# Patient Record
Sex: Female | Born: 2005 | Race: Black or African American | Hispanic: No | Marital: Single | Smoking: Never smoker
Health system: Southern US, Community
[De-identification: ages and names within clinical notes are randomized; demographics above are authoritative.]

## PROBLEM LIST (undated history)

## (undated) HISTORY — PX: TONSILLECTOMY: SUR1361

---
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
## 2024-04-26 ENCOUNTER — Emergency Department (HOSPITAL_COMMUNITY)
Admission: EM | Admit: 2024-04-26 | Discharge: 2024-04-26 | Disposition: A | Discharge status: Home / Self Care | Attending: Emergency Medicine | Admitting: Emergency Medicine

## 2024-04-26 ENCOUNTER — Encounter (HOSPITAL_COMMUNITY): Payer: Self-pay | Admitting: *Deleted

## 2024-04-26 ENCOUNTER — Other Ambulatory Visit: Payer: Self-pay

## 2024-04-26 DIAGNOSIS — R42 Dizziness and giddiness: Secondary | ICD-10-CM | POA: Diagnosis present

## 2024-04-26 LAB — CBC WITH DIFFERENTIAL/PLATELET
Abs Immature Granulocytes: 0.03 10*3/uL (ref 0.00–0.07)
Basophils Absolute: 0 10*3/uL (ref 0.0–0.1)
Basophils Relative: 0 %
Eosinophils Absolute: 0.2 10*3/uL (ref 0.0–0.5)
Eosinophils Relative: 2 %
HCT: 40.7 % (ref 36.0–46.0)
Hemoglobin: 13 g/dL (ref 12.0–15.0)
Immature Granulocytes: 0 %
Lymphocytes Relative: 16 %
Lymphs Abs: 1.5 10*3/uL (ref 0.7–4.0)
MCH: 27.8 pg (ref 26.0–34.0)
MCHC: 31.9 g/dL (ref 30.0–36.0)
MCV: 87 fL (ref 80.0–100.0)
Monocytes Absolute: 0.6 10*3/uL (ref 0.1–1.0)
Monocytes Relative: 7 %
Neutro Abs: 7.2 10*3/uL (ref 1.7–7.7)
Neutrophils Relative %: 75 %
Platelets: 262 10*3/uL (ref 150–400)
RBC: 4.68 MIL/uL (ref 3.87–5.11)
RDW: 11.9 % (ref 11.5–15.5)
WBC: 9.5 10*3/uL (ref 4.0–10.5)
nRBC: 0 % (ref 0.0–0.2)

## 2024-04-26 LAB — URINALYSIS, ROUTINE W REFLEX MICROSCOPIC
Bilirubin Urine: NEGATIVE
Glucose, UA: NEGATIVE mg/dL
Hgb urine dipstick: NEGATIVE
Ketones, ur: NEGATIVE mg/dL
Leukocytes,Ua: NEGATIVE
Nitrite: NEGATIVE
Protein, ur: 30 mg/dL — AB
Specific Gravity, Urine: 1.025 (ref 1.005–1.030)
pH: 7 (ref 5.0–8.0)

## 2024-04-26 LAB — BASIC METABOLIC PANEL WITH GFR
Anion gap: 10 (ref 5–15)
BUN: 12 mg/dL (ref 6–20)
CO2: 24 mmol/L (ref 22–32)
Calcium: 9.3 mg/dL (ref 8.9–10.3)
Chloride: 108 mmol/L (ref 98–111)
Creatinine, Ser: 0.85 mg/dL (ref 0.44–1.00)
GFR, Estimated: >60 mL/min (ref >60)
Glucose, Bld: 89 mg/dL (ref 70–99)
Potassium: 3.8 mmol/L (ref 3.5–5.1)
Sodium: 142 mmol/L (ref 135–145)

## 2024-04-26 LAB — PREGNANCY, URINE: Preg Test, Ur: NEGATIVE

## 2024-04-26 NOTE — ED Provider Notes (Signed)
 [facility] Provider Note   CSN: [id number] Arrival date & time: 04/26/24  1427    History  Chief Complaint  Patient presents with   Dizziness    Cathy Ward is a 18 y.o. female here for evaluation of lightheadedness.  States woke up this morning while she was at work she felt lightheaded for a few minutes.  Symptoms resolved prior to arrival to the emergency department.  No headache, vision changes, numbness, weakness, chest pain, shortness of breath, abdominal pain. Due for cycle.  No dysuria or hematuria.  No history of aneurysms, connective tissue disorders. no recent head trauma.  HPI     Home Medications Prior to Admission medications   Not on File      Allergies    Patient has no known allergies.    Review of Systems   Review of Systems  Constitutional: Negative.   HENT: Negative.    Respiratory: Negative.    Cardiovascular: Negative.   Gastrointestinal: Negative.   Genitourinary: Negative.   Musculoskeletal: Negative.   Skin: Negative.   Neurological:  Positive for light-headedness.  All other systems reviewed and are negative.   Physical Exam Updated Vital Signs BP 108/81 (BP Location: Left Arm)   Pulse (!) 59   Temp 98.3 F (36.8 C)   Resp 18   Ht 5\' 7"  (1.702 m)   Wt 61.7 kg   LMP 03/27/2024   SpO2 100%   BMI 21.30 kg/m  Physical Exam Vitals and nursing note reviewed.  Constitutional:      General: She is not in acute distress.    Appearance: She is well-developed. She is not ill-appearing, toxic-appearing or diaphoretic.  HENT:     Head: Normocephalic and atraumatic.     Nose: Nose normal.     Mouth/Throat:     Mouth: Mucous membranes are moist.  Eyes:     Pupils: Pupils are equal, round, and reactive to light.  Cardiovascular:     Rate and Rhythm: Normal rate.     Pulses: Normal pulses.          Radial pulses are 2+ on the right side and 2+ on the left side.     Heart sounds: Normal  heart sounds.  Pulmonary:     Effort: Pulmonary effort is normal. No respiratory distress.     Breath sounds: Normal breath sounds.  Abdominal:     General: Bowel sounds are normal. There is no distension.     Palpations: Abdomen is soft.  Musculoskeletal:        General: No tenderness or signs of injury. Normal range of motion.     Cervical back: Normal range of motion.     Right lower leg: No edema.     Left lower leg: No edema.  Skin:    General: Skin is warm and dry.     Capillary Refill: Capillary refill takes less than 2 seconds.  Neurological:     General: No focal deficit present.     Mental Status: She is alert and oriented to person, place, and time.     Cranial Nerves: No cranial nerve deficit.     Motor: No weakness.     Gait: Gait normal.  Psychiatric:        Mood and Affect: Mood normal.     ED Results / Procedures / Treatments   Labs (all labs ordered are listed, but only abnormal results are displayed) Labs Reviewed  URINALYSIS,  ROUTINE W REFLEX MICROSCOPIC - Abnormal; Notable for the following components:      Result Value   APPearance HAZY (*)    Protein, ur 30 (*)    Bacteria, UA RARE (*)    All other components within normal limits  BASIC METABOLIC PANEL WITH GFR  CBC WITH DIFFERENTIAL/PLATELET  PREGNANCY, URINE    EKG None  Radiology No results found.  Procedures Procedures    Medications Ordered in ED Medications - No data to display  ED Course/ Medical Decision Making/ A&P   18 year old here for evaluation of lightheaded episode earlier today while at work.  Lasted for a few minutes and self resolved.  No history of similar.  She has a nonfocal neuroexam without deficits.  Denies vision changes, nausea, vomiting, headache, shortness of breath, chest pain.  No sudden onset thunderclap headache.  No history of connective tissue disorders, aneurysms.  She has been asymptomatic for hours.   Labs and imaging personally viewed and  interpreted:  EKG without ischemic changes, QTC 429 UA neg for infection Preg neg CBC without leukocytosis BMP without abnormality  Discussed labs with patient and friend in room. Will have her keep close eye on symptoms, increase fluids. Return for new or worsening symptoms  The patient has been appropriately medically screened and/or stabilized in the ED. I have low suspicion for any other emergent medical condition which would require further screening, evaluation or treatment in the ED or require inpatient management.  Patient is hemodynamically stable and in no acute distress.  Patient able to ambulate in department prior to ED.  Evaluation does not show acute pathology that would require ongoing or additional emergent interventions while in the emergency department or further inpatient treatment.  I have discussed the diagnosis with the patient and answered all questions.  Pain is been managed while in the emergency department and patient has no further complaints prior to discharge.  Patient is comfortable with plan discussed in room and is stable for discharge at this time.  I have discussed strict return precautions for returning to the emergency department.  Patient was encouraged to follow-up with PCP/specialist refer to at discharge.                                 Medical Decision Making Amount and/or Complexity of Data Reviewed Independent Historian: friend External Data Reviewed: labs, ECG and notes. Labs: ordered. Decision-making details documented in ED Course. ECG/medicine tests: ordered and independent interpretation performed. Decision-making details documented in ED Course.  Risk OTC drugs. Decision regarding hospitalization. Diagnosis or treatment significantly limited by social determinants of health.          Final Clinical Impression(s) / ED Diagnoses Final diagnoses:  Dizziness    Rx / DC Orders ED Discharge Orders     None         Praveen Coia,  Laneah Luft A, PA-C 04/26/24 1752    Trish Furl, MD 04/27/24 [phone]

## 2024-04-26 NOTE — ED Provider Triage Note (Signed)
 Emergency Medicine Provider Triage Evaluation Note  Cathy Ward , a 18 y.o. female  was evaluated in triage.  Pt complains of dizziness episode x few minutes this morning while at work  Review of Systems  Positive: had chills during dizziness episode Negative: Vision changes, N/V, dizziness, CP, SHOB, fever,   Physical Exam  There were no vitals taken for this visit. Gen:   Awake, no distress   Resp:  Normal effort  MSK:   Moves extremities without difficulty  Other:    Medical Decision Making  Medically screening exam initiated at 3:40 PM.  Appropriate orders placed.  Kenleigh Ashwell was informed that the remainder of the evaluation will be completed by another provider, this initial triage assessment does not replace that evaluation, and the importance of remaining in the ED until their evaluation is complete.  Labs ordered   Carie Charity, PA-C 04/26/24 1542

## 2024-04-26 NOTE — ED Triage Notes (Signed)
 The pt is c/o dizziness just today but she is not dizzy anymore  lmp  last month

## 2024-04-26 NOTE — Discharge Instructions (Signed)
 It was a pleasure taking care of you here today  Make sure to follow-up outpatient, keep well-hydrated  Keep a close eye on your symptoms  Return for new or worsening symptoms

## 2024-06-13 ENCOUNTER — Emergency Department (HOSPITAL_COMMUNITY)
Admission: EM | Admit: 2024-06-13 | Discharge: 2024-06-14 | Discharge status: Against Medical Advice | Attending: Emergency Medicine | Admitting: Emergency Medicine

## 2024-06-13 DIAGNOSIS — Z5321 Procedure and treatment not carried out due to patient leaving prior to being seen by health care provider: Secondary | ICD-10-CM | POA: Diagnosis not present

## 2024-06-13 DIAGNOSIS — M79606 Pain in leg, unspecified: Secondary | ICD-10-CM | POA: Diagnosis present

## 2024-06-13 NOTE — ED Notes (Signed)
 Called no answer

## 2024-06-19 ENCOUNTER — Encounter (HOSPITAL_COMMUNITY): Payer: Self-pay

## 2024-06-19 ENCOUNTER — Other Ambulatory Visit: Payer: Self-pay

## 2024-06-19 ENCOUNTER — Emergency Department (HOSPITAL_COMMUNITY)
Admission: EM | Admit: 2024-06-19 | Discharge: 2024-06-19 | Disposition: A | Discharge status: Home / Self Care | Attending: Emergency Medicine | Admitting: Emergency Medicine

## 2024-06-19 DIAGNOSIS — M25572 Pain in left ankle and joints of left foot: Secondary | ICD-10-CM | POA: Insufficient documentation

## 2024-06-19 DIAGNOSIS — Y92410 Unspecified street and highway as the place of occurrence of the external cause: Secondary | ICD-10-CM | POA: Insufficient documentation

## 2024-06-19 DIAGNOSIS — M79662 Pain in left lower leg: Secondary | ICD-10-CM | POA: Diagnosis present

## 2024-06-19 MED ORDER — METHOCARBAMOL 500 MG PO TABS: 500.0000 mg | ORAL_TABLET | Freq: Two times a day (BID) | ORAL | 0 refills | Status: AC

## 2024-06-19 NOTE — Discharge Instructions (Addendum)
 Please use Tylenol or ibuprofen  for pain.  You may use 600 mg ibuprofen  every 6 hours or 1000 mg of Tylenol every 6 hours.  You may choose to alternate between the 2.  This would be most effective.  Not to exceed 4 g of Tylenol within 24 hours.  Not to exceed 3200 mg ibuprofen  24 hours.  You can use the muscle relaxant I am prescribing in addition to the above to help with any breakthrough pain.  You can take it up to twice daily.  It is safe to take at night, but I would be cautious taking it during the day as it can cause some drowsiness.  Make sure that you are feeling awake and alert before you get behind the wheel of a car or operate a motor vehicle.  It is not a narcotic pain medication so you are able to take it if it is not making you drowsy and still pilot a vehicle or machinery safely.  When you are not walking or working I recommend elevating the leg, and placing ice where it is hurting.
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
## 2024-07-14 ENCOUNTER — Emergency Department (HOSPITAL_COMMUNITY): Admission: EM | Admit: 2024-07-14 | Discharge: 2024-07-15 | Discharge status: Against Medical Advice

## 2024-07-14 ENCOUNTER — Encounter (HOSPITAL_COMMUNITY): Payer: Self-pay

## 2024-07-14 ENCOUNTER — Other Ambulatory Visit: Payer: Self-pay

## 2024-07-14 DIAGNOSIS — Z5321 Procedure and treatment not carried out due to patient leaving prior to being seen by health care provider: Secondary | ICD-10-CM | POA: Insufficient documentation

## 2024-07-14 DIAGNOSIS — F419 Anxiety disorder, unspecified: Secondary | ICD-10-CM | POA: Insufficient documentation

## 2024-07-14 DIAGNOSIS — R0602 Shortness of breath: Secondary | ICD-10-CM | POA: Diagnosis not present

## 2024-07-14 MED ORDER — LORAZEPAM 1 MG PO TABS
1.0000 mg | ORAL_TABLET | Freq: Once | ORAL | Status: AC
  Administered 2024-07-14: 1 mg via ORAL
  Filled 2024-07-14: qty 1

## 2024-07-14 NOTE — ED Provider Triage Note (Addendum)
 Emergency Medicine Provider Triage Evaluation Note  Cathy Ward , a 18 y.o. female  was evaluated in triage.  Pt complains of shortness of breath/anxiety.  Patient refuses to answer any of my additional questions but says that her shortness of breath started earlier today.  She says that she is upset and wants some water to drink.   Review of Systems  Positive: As above Negative: As above  Physical Exam  BP 100/63   Pulse 80   Temp (!) 97.4 F (36.3 C)   Resp 19   Ht 5' 7 (1.702 m)   Wt 57.6 kg   LMP 07/12/2024 (Approximate)   SpO2 94%   BMI 19.89 kg/m  Gen:   Awake, no distress   Resp:  Normal effort  MSK:   Moves extremities without difficulty  Other:    Medical Decision Making  Medically screening exam initiated at 5:31 PM.  Appropriate orders placed.  Cathy Ward was informed that the remainder of the evaluation will be completed by another provider, this initial triage assessment does not replace that evaluation, and the importance of remaining in the ED until their evaluation is complete.   Anxious appearing, refuses to answer any of my questions.    Cathy Ward SAILOR, [place] 07/14/24 1734
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
## 2024-07-14 NOTE — ED Notes (Signed)
 Pt called multiple times for vitals and room, no response
